# Patient Record
Sex: Female | Born: 2000 | Race: Black or African American | Hispanic: No | Marital: Single | Smoking: Never smoker
Health system: Southern US, Community
[De-identification: ages and names within clinical notes are randomized; demographics above are authoritative.]

---
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
## 2020-02-18 ENCOUNTER — Other Ambulatory Visit: Payer: Self-pay

## 2020-02-18 ENCOUNTER — Encounter: Payer: Self-pay | Admitting: Emergency Medicine

## 2020-02-18 ENCOUNTER — Emergency Department: Payer: No Typology Code available for payment source

## 2020-02-18 ENCOUNTER — Emergency Department
Admission: EM | Admit: 2020-02-18 | Discharge: 2020-02-18 | Disposition: A | Discharge status: Home / Self Care | Payer: No Typology Code available for payment source | Attending: Emergency Medicine | Admitting: Emergency Medicine

## 2020-02-18 DIAGNOSIS — S40211A Abrasion of right shoulder, initial encounter: Secondary | ICD-10-CM | POA: Insufficient documentation

## 2020-02-18 DIAGNOSIS — T148XXA Other injury of unspecified body region, initial encounter: Secondary | ICD-10-CM

## 2020-02-18 DIAGNOSIS — S40011A Contusion of right shoulder, initial encounter: Secondary | ICD-10-CM | POA: Insufficient documentation

## 2020-02-18 DIAGNOSIS — S4991XA Unspecified injury of right shoulder and upper arm, initial encounter: Secondary | ICD-10-CM | POA: Diagnosis present

## 2020-02-18 DIAGNOSIS — Y92410 Unspecified street and highway as the place of occurrence of the external cause: Secondary | ICD-10-CM | POA: Insufficient documentation

## 2020-02-18 DIAGNOSIS — Y999 Unspecified external cause status: Secondary | ICD-10-CM | POA: Insufficient documentation

## 2020-02-18 DIAGNOSIS — Y939 Activity, unspecified: Secondary | ICD-10-CM | POA: Diagnosis not present

## 2020-02-18 DIAGNOSIS — S40219A Abrasion of unspecified shoulder, initial encounter: Secondary | ICD-10-CM

## 2020-02-18 DIAGNOSIS — S40851A Superficial foreign body of right upper arm, initial encounter: Secondary | ICD-10-CM | POA: Diagnosis not present

## 2020-02-18 MED ORDER — CYCLOBENZAPRINE HCL 5 MG PO TABS: 5.0000 mg | ORAL_TABLET | Freq: Three times a day (TID) | ORAL | 0 refills | Status: DC | PRN

## 2020-02-18 MED ORDER — BACITRACIN ZINC 500 UNIT/GM EX OINT
TOPICAL_OINTMENT | Freq: Once | CUTANEOUS | Status: AC
  Administered 2020-02-18: 1 via TOPICAL
  Filled 2020-02-18: qty 0.9

## 2020-02-18 MED ORDER — AMOXICILLIN-POT CLAVULANATE 875-125 MG PO TABS: 1.0000 | ORAL_TABLET | Freq: Two times a day (BID) | ORAL | 0 refills | Status: AC

## 2020-02-18 MED ORDER — IBUPROFEN 800 MG PO TABS
800.0000 mg | ORAL_TABLET | Freq: Once | ORAL | Status: AC
  Administered 2020-02-18: 20:00:00 800 mg via ORAL
  Filled 2020-02-18: qty 1

## 2020-02-18 MED ORDER — IBUPROFEN 800 MG PO TABS: 800.0000 mg | ORAL_TABLET | Freq: Three times a day (TID) | ORAL | 0 refills | Status: DC | PRN

## 2020-02-18 MED ORDER — AMOXICILLIN-POT CLAVULANATE 875-125 MG PO TABS
1.0000 | ORAL_TABLET | Freq: Once | ORAL | Status: AC
  Administered 2020-02-18: 21:00:00 1 via ORAL
  Filled 2020-02-18: qty 1

## 2020-02-18 MED ORDER — LIDOCAINE-EPINEPHRINE-TETRACAINE (LET) TOPICAL GEL
3.0000 mL | Freq: Once | TOPICAL | Status: AC
  Administered 2020-02-18: 20:00:00 3 mL via TOPICAL
  Filled 2020-02-18: qty 3

## 2020-02-18 MED ORDER — BACITRACIN ZINC 500 UNIT/GM EX OINT: TOPICAL_OINTMENT | Freq: Once | CUTANEOUS | Status: DC

## 2020-02-18 NOTE — Discharge Instructions (Addendum)
Please rest ice and elevate the right shoulder.  Apply antibiotic ointment daily along with daily dressing changes.  You may shower and get wet but do not submerge underwater until wound is completely healed.  Take muscle relaxer and ibuprofen as needed for pain and muscle tightness.  Return to the ER for any increasing pain swelling warmth redness or any urgent changes in your health.
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
## 2023-01-21 ENCOUNTER — Telehealth (HOSPITAL_COMMUNITY): Payer: Self-pay | Admitting: *Deleted

## 2023-01-21 NOTE — Telephone Encounter (Signed)
Opened in Error.

## 2023-03-25 ENCOUNTER — Ambulatory Visit
Admission: EM | Admit: 2023-03-25 | Discharge: 2023-03-25 | Disposition: A | Discharge status: Home / Self Care | Payer: Medicaid Other | Attending: Emergency Medicine | Admitting: Emergency Medicine

## 2023-03-25 DIAGNOSIS — N390 Urinary tract infection, site not specified: Secondary | ICD-10-CM | POA: Insufficient documentation

## 2023-03-25 LAB — URINALYSIS, W/ REFLEX TO CULTURE (INFECTION SUSPECTED)
Bilirubin Urine: NEGATIVE
Glucose, UA: NEGATIVE mg/dL
Ketones, ur: NEGATIVE mg/dL
Leukocytes,Ua: NEGATIVE
Nitrite: NEGATIVE
Protein, ur: 100 mg/dL — AB
Specific Gravity, Urine: 1.025 (ref 1.005–1.030)
pH: 8.5 — ABNORMAL HIGH (ref 5.0–8.0)

## 2023-03-25 LAB — PREGNANCY, URINE: Preg Test, Ur: NEGATIVE

## 2023-03-25 MED ORDER — NITROFURANTOIN MONOHYD MACRO 100 MG PO CAPS: 100.0000 mg | ORAL_CAPSULE | Freq: Two times a day (BID) | ORAL | 0 refills | Status: AC

## 2023-03-25 MED ORDER — PHENAZOPYRIDINE HCL 200 MG PO TABS: 200.0000 mg | ORAL_TABLET | Freq: Three times a day (TID) | ORAL | 0 refills | Status: AC

## 2023-03-25 NOTE — Discharge Instructions (Signed)

## 2023-03-25 NOTE — ED Provider Notes (Signed)
[facility]    CSN: [id number] Arrival date & time: 03/25/23  1337      History   Chief Complaint Chief Complaint  Patient presents with   Dysuria   Hematuria    HPI Jeanette Navarro is a 22 y.o. female.   HPI  22 year old female with no significant past medical history presents for evaluation of UTI symptoms which began last night.  The symptoms consist of pain with urination, urinary urgency and frequency, nocturia, and blood in the urine.  She denies any back pain, fever, nausea, vomiting, vaginal discharge, or vaginal itching.  She also denies abdominal pain.  Patient was admitted for sepsis due to a UTI in 2022.  History reviewed. No pertinent past medical history.  There are no problems to display for this patient.   History reviewed. No pertinent surgical history.  OB History   No obstetric history on file.      Home Medications    Prior to Admission medications   Medication Sig Start Date End Date Taking? Authorizing Provider  nitrofurantoin, macrocrystal-monohydrate, (MACROBID) 100 MG capsule Take 1 capsule (100 mg total) by mouth 2 (two) times daily. 03/25/23  Yes Becky Augusta, NP  phenazopyridine (PYRIDIUM) 200 MG tablet Take 1 tablet (200 mg total) by mouth 3 (three) times daily. 03/25/23  Yes Becky Augusta, NP    Family History History reviewed. No pertinent family history.  Social History Social History   Tobacco Use   Smoking status: Never  Substance Use Topics   Alcohol use: No   Drug use: No     Allergies   Patient has no known allergies.   Review of Systems Review of Systems  Constitutional:  Negative for fever.  Gastrointestinal:  Negative for abdominal pain, nausea and vomiting.  Genitourinary:  Positive for dysuria, frequency, hematuria and urgency. Negative for vaginal discharge and vaginal pain.  Musculoskeletal:  Negative for back pain.     Physical Exam Triage Vital Signs ED Triage Vitals  Enc Vitals Group      BP 03/25/23 1344 125/80     Pulse Rate 03/25/23 1344 (!) 102     Resp 03/25/23 1344 16     Temp 03/25/23 1344 99 F (37.2 C)     Temp Source 03/25/23 1344 Oral     SpO2 03/25/23 1344 97 %     Weight 03/25/23 1344 150 lb (68 kg)     Height 03/25/23 1344 5\' 2"  (1.575 m)     Head Circumference --      Peak Flow --      Pain Score 03/25/23 1346 0     Pain Loc --      Pain Edu? --      Excl. in GC? --    No data found.  Updated Vital Signs BP 125/80 (BP Location: Left Arm)   Pulse (!) 102   Temp 99 F (37.2 C) (Oral)   Resp 16   Ht 5\' 2"  (1.575 m)   Wt 150 lb (68 kg)   SpO2 97%   BMI 27.44 kg/m   Visual Acuity Right Eye Distance:   Left Eye Distance:   Bilateral Distance:    Right Eye Near:   Left Eye Near:    Bilateral Near:     Physical Exam Vitals and nursing note reviewed.  Constitutional:      Appearance: Normal appearance.  HENT:     Head: Normocephalic and atraumatic.  Cardiovascular:     Rate and  Rhythm: Normal rate and regular rhythm.     Pulses: Normal pulses.     Heart sounds: Normal heart sounds. No murmur heard.    No friction rub. No gallop.  Pulmonary:     Effort: Pulmonary effort is normal.     Breath sounds: Normal breath sounds. No wheezing, rhonchi or rales.  Abdominal:     Tenderness: There is no right CVA tenderness or left CVA tenderness.  Neurological:     Mental Status: She is alert.      UC Treatments / Results  Labs (all labs ordered are listed, but only abnormal results are displayed) Labs Reviewed  URINALYSIS, W/ REFLEX TO CULTURE (INFECTION SUSPECTED) - Abnormal; Notable for the following components:      Result Value   pH 8.5 (*)    Hgb urine dipstick MODERATE (*)    Protein, ur 100 (*)    Bacteria, UA FEW (*)    All other components within normal limits  URINE CULTURE  PREGNANCY, URINE    EKG   Radiology No results found.  Procedures Procedures (including critical care time)  Medications Ordered in  UC Medications - No data to display  Initial Impression / Assessment and Plan / UC Course  I have reviewed the triage vital signs and the nursing notes.  Pertinent labs & imaging results that were available during my care of the patient were reviewed by me and considered in my medical decision making (see chart for details).   Patient is a pleasant, nontoxic-appearing 22 year old female presenting for evaluation of UTI symptoms which began last night as outlined in HPI above.  Patient has had UTIs in the past and was hospitalized for urosepsis in 2022.  She denies any fever at home but she does have an elevated temp of 99 here and is mildly tachycardic with a heart rate of 102.  Blood pressure is normal at 125/80.  Her physical exam reveals a benign cardiopulmonary exam and no CVA tenderness on exam.  I will order urinalysis to evaluate for the presence of urinary tract infection.  She denies any vaginal itching or discharge.  LMP is not listed in epic so I will order a pregnancy test as well.  Urinalysis shows 100 protein with moderate hemoglobin but is negative for leukocyte esterase or nitrites.  Reflex microscopy shows 21-50 RBCs with 6-10 WBCs and few bacteria.  I will send urine for culture.  Urine pregnancy test is negative.  I will discharge patient on the diagnosis of UTI and start her on Macrobid 100 mg twice daily for 5 days.  I will also prescribe Pyridium help with urinary discomfort and I have encouraged patient to increase her oral fluid intake to increase urine production and flush her urinary tract.   Final Clinical Impressions(s) / UC Diagnoses   Final diagnoses:  Lower urinary tract infectious disease     Discharge Instructions      Take the Macrobid twice daily for 5 days with food for treatment of urinary tract infection.  Use the Pyridium every 8 hours as needed for urinary discomfort.  This will turn your urine a bright red-orange.  Increase your oral fluid  intake so that you increase your urine production and or flushing your urinary system.  Take an over-the-counter probiotic, such as Culturelle-Align-Activia, 1 hour after each dose of antibiotic to prevent diarrhea or yeast infections from forming.  We will culture urine and change the antibiotics if necessary.  Return for reevaluation, or see  your primary care provider, for any new or worsening symptoms.      ED Prescriptions     Medication Sig Dispense Auth. Provider   nitrofurantoin, macrocrystal-monohydrate, (MACROBID) 100 MG capsule Take 1 capsule (100 mg total) by mouth 2 (two) times daily. 10 capsule Becky Augusta, NP   phenazopyridine (PYRIDIUM) 200 MG tablet Take 1 tablet (200 mg total) by mouth 3 (three) times daily. 6 tablet Becky Augusta, NP      PDMP not reviewed this encounter.   Becky Augusta, NP 03/25/23 1440

## 2023-03-25 NOTE — ED Triage Notes (Signed)
Pt c/o urinary freq & hematuria x1 day. Denies any burning or pain.
